# Patient Record
Sex: Male | Born: 1985 | Race: Black or African American | Hispanic: No | Marital: Single | Smoking: Current some day smoker
Health system: Southern US, Community
[De-identification: ages and names within clinical notes are randomized; demographics above are authoritative.]

---

## 2006-12-29 ENCOUNTER — Emergency Department (HOSPITAL_COMMUNITY): Admission: EM | Admit: 2006-12-29 | Discharge: 2006-12-30 | Payer: Self-pay | Admitting: Emergency Medicine

## 2008-05-11 ENCOUNTER — Emergency Department (HOSPITAL_COMMUNITY): Admission: EM | Admit: 2008-05-11 | Discharge: 2008-05-11 | Payer: Self-pay | Admitting: Emergency Medicine

## 2009-08-17 ENCOUNTER — Inpatient Hospital Stay (HOSPITAL_COMMUNITY): Admission: EM | Admit: 2009-08-17 | Discharge: 2009-08-21 | Payer: Self-pay | Admitting: Emergency Medicine

## 2009-08-17 ENCOUNTER — Ambulatory Visit: Payer: Self-pay | Admitting: Internal Medicine

## 2010-05-20 ENCOUNTER — Emergency Department (HOSPITAL_COMMUNITY): Admission: EM | Admit: 2010-05-20 | Discharge: 2010-05-20 | Payer: Self-pay | Admitting: Emergency Medicine

## 2010-11-28 ENCOUNTER — Emergency Department (HOSPITAL_COMMUNITY)
Admission: EM | Admit: 2010-11-28 | Discharge: 2010-11-28 | Disposition: A | Discharge status: Home / Self Care | Payer: Self-pay | Attending: Emergency Medicine | Admitting: Emergency Medicine

## 2010-11-28 DIAGNOSIS — K047 Periapical abscess without sinus: Secondary | ICD-10-CM | POA: Insufficient documentation

## 2010-11-28 DIAGNOSIS — K089 Disorder of teeth and supporting structures, unspecified: Secondary | ICD-10-CM | POA: Insufficient documentation

## 2010-12-10 LAB — URINE MICROSCOPIC-ADD ON

## 2010-12-10 LAB — COMPREHENSIVE METABOLIC PANEL
ALT: 141 U/L — ABNORMAL HIGH (ref 0–53)
ALT: 78 U/L — ABNORMAL HIGH (ref 0–53)
AST: 228 U/L — ABNORMAL HIGH (ref 0–37)
AST: 24 U/L (ref 0–37)
AST: 45 U/L — ABNORMAL HIGH (ref 0–37)
AST: 82 U/L — ABNORMAL HIGH (ref 0–37)
Albumin: 3.2 g/dL — ABNORMAL LOW (ref 3.5–5.2)
Albumin: 3.4 g/dL — ABNORMAL LOW (ref 3.5–5.2)
Albumin: 4 g/dL (ref 3.5–5.2)
Alkaline Phosphatase: 54 U/L (ref 39–117)
Alkaline Phosphatase: 66 U/L (ref 39–117)
Alkaline Phosphatase: 80 U/L (ref 39–117)
BUN: 12 mg/dL (ref 6–23)
BUN: 3 mg/dL — ABNORMAL LOW (ref 6–23)
CO2: 28 mEq/L (ref 19–32)
CO2: 30 mEq/L (ref 19–32)
CO2: 30 mEq/L (ref 19–32)
Calcium: 8.6 mg/dL (ref 8.4–10.5)
Calcium: 8.8 mg/dL (ref 8.4–10.5)
Chloride: 103 mEq/L (ref 96–112)
Chloride: 104 mEq/L (ref 96–112)
Chloride: 106 mEq/L (ref 96–112)
Chloride: 107 mEq/L (ref 96–112)
Creatinine, Ser: 0.98 mg/dL (ref 0.4–1.5)
Creatinine, Ser: 1.01 mg/dL (ref 0.4–1.5)
Creatinine, Ser: 1.19 mg/dL (ref 0.4–1.5)
GFR calc Af Amer: >60 mL/min (ref >60)
GFR calc Af Amer: >60 mL/min (ref >60)
GFR calc Af Amer: >60 mL/min (ref >60)
GFR calc Af Amer: >60 mL/min (ref >60)
GFR calc non Af Amer: >60 mL/min (ref >60)
GFR calc non Af Amer: >60 mL/min (ref >60)
Glucose, Bld: 167 mg/dL — ABNORMAL HIGH (ref 70–99)
Potassium: 3.4 mEq/L — ABNORMAL LOW (ref 3.5–5.1)
Potassium: 3.6 mEq/L (ref 3.5–5.1)
Potassium: 4 mEq/L (ref 3.5–5.1)
Sodium: 139 mEq/L (ref 135–145)
Sodium: 142 mEq/L (ref 135–145)
Sodium: 143 mEq/L (ref 135–145)
Total Bilirubin: 1 mg/dL (ref 0.3–1.2)
Total Bilirubin: 1.1 mg/dL (ref 0.3–1.2)
Total Bilirubin: 1.1 mg/dL (ref 0.3–1.2)
Total Bilirubin: 2.1 mg/dL — ABNORMAL HIGH (ref 0.3–1.2)
Total Protein: 5.7 g/dL — ABNORMAL LOW (ref 6.0–8.3)
Total Protein: 5.8 g/dL — ABNORMAL LOW (ref 6.0–8.3)
Total Protein: 6.7 g/dL (ref 6.0–8.3)

## 2010-12-10 LAB — DIFFERENTIAL
Basophils Absolute: 0 10*3/uL (ref 0.0–0.1)
Basophils Absolute: 0 10*3/uL (ref 0.0–0.1)
Basophils Relative: 0 % (ref 0–1)
Basophils Relative: 1 % (ref 0–1)
Eosinophils Relative: 6 % — ABNORMAL HIGH (ref 0–5)
Lymphocytes Relative: 20 % (ref 12–46)
Lymphocytes Relative: 4 % — ABNORMAL LOW (ref 12–46)
Monocytes Absolute: 0.2 10*3/uL (ref 0.1–1.0)
Monocytes Absolute: 1.4 10*3/uL — ABNORMAL HIGH (ref 0.1–1.0)
Monocytes Relative: 18 % — ABNORMAL HIGH (ref 3–12)
Neutro Abs: 9.8 10*3/uL — ABNORMAL HIGH (ref 1.7–7.7)
Neutrophils Relative %: 93 % — ABNORMAL HIGH (ref 43–77)

## 2010-12-10 LAB — CBC
HCT: 36.8 % — ABNORMAL LOW (ref 39.0–52.0)
HCT: 39.3 % (ref 39.0–52.0)
HCT: 42 % (ref 39.0–52.0)
Hemoglobin: 12.8 g/dL — ABNORMAL LOW (ref 13.0–17.0)
Hemoglobin: 13.6 g/dL (ref 13.0–17.0)
Hemoglobin: 14.6 g/dL (ref 13.0–17.0)
MCHC: 33.5 g/dL (ref 30.0–36.0)
MCHC: 34.4 g/dL (ref 30.0–36.0)
MCHC: 34.7 g/dL (ref 30.0–36.0)
MCV: 95.3 fL (ref 78.0–100.0)
MCV: 95.5 fL (ref 78.0–100.0)
MCV: 96.8 fL (ref 78.0–100.0)
MCV: 96.9 fL (ref 78.0–100.0)
Platelets: 137 10*3/uL — ABNORMAL LOW (ref 150–400)
Platelets: 143 10*3/uL — ABNORMAL LOW (ref 150–400)
Platelets: 161 10*3/uL (ref 150–400)
Platelets: 174 10*3/uL (ref 150–400)
RBC: 3.8 MIL/uL — ABNORMAL LOW (ref 4.22–5.81)
RBC: 3.89 MIL/uL — ABNORMAL LOW (ref 4.22–5.81)
RBC: 3.96 MIL/uL — ABNORMAL LOW (ref 4.22–5.81)
RDW: 12.2 % (ref 11.5–15.5)
RDW: 12.3 % (ref 11.5–15.5)
RDW: 12.4 % (ref 11.5–15.5)
WBC: 5.5 10*3/uL (ref 4.0–10.5)
WBC: 7.6 10*3/uL (ref 4.0–10.5)

## 2010-12-10 LAB — ACETAMINOPHEN LEVEL: Acetaminophen (Tylenol), Serum: <10 ug/mL — ABNORMAL LOW (ref 10–30)

## 2010-12-10 LAB — HEPATITIS PANEL, ACUTE
HCV Ab: NEGATIVE
Hepatitis B Surface Ag: NEGATIVE

## 2010-12-10 LAB — HEPATIC FUNCTION PANEL
AST: 33 U/L (ref 0–37)
AST: 656 U/L — ABNORMAL HIGH (ref 0–37)
Bilirubin, Direct: 0.3 mg/dL (ref 0.0–0.3)
Bilirubin, Direct: 1.4 mg/dL — ABNORMAL HIGH (ref 0.0–0.3)
Indirect Bilirubin: 1 mg/dL — ABNORMAL HIGH (ref 0.3–0.9)
Indirect Bilirubin: 1.3 mg/dL — ABNORMAL HIGH (ref 0.3–0.9)
Total Bilirubin: 1.3 mg/dL — ABNORMAL HIGH (ref 0.3–1.2)
Total Bilirubin: 2.7 mg/dL — ABNORMAL HIGH (ref 0.3–1.2)

## 2010-12-10 LAB — HIV ANTIBODY (ROUTINE TESTING W REFLEX): HIV: NONREACTIVE

## 2010-12-10 LAB — RAPID URINE DRUG SCREEN, HOSP PERFORMED
Amphetamines: NOT DETECTED
Barbiturates: NOT DETECTED
Benzodiazepines: NOT DETECTED

## 2010-12-10 LAB — CULTURE, BLOOD (ROUTINE X 2)

## 2010-12-10 LAB — RPR: RPR Ser Ql: NONREACTIVE

## 2010-12-10 LAB — URINALYSIS, ROUTINE W REFLEX MICROSCOPIC
Hgb urine dipstick: NEGATIVE
Nitrite: NEGATIVE
Protein, ur: NEGATIVE mg/dL
Specific Gravity, Urine: 1.031 — ABNORMAL HIGH (ref 1.005–1.030)
Urobilinogen, UA: 2 mg/dL — ABNORMAL HIGH (ref 0.0–1.0)

## 2010-12-10 LAB — HEPATITIS B SURFACE ANTIGEN: Hepatitis B Surface Ag: NEGATIVE

## 2010-12-10 LAB — ETHANOL: Alcohol, Ethyl (B): <5 mg/dL (ref 0–10)

## 2010-12-10 LAB — C-REACTIVE PROTEIN: CRP: 1.4 mg/dL — ABNORMAL HIGH (ref <0.6)

## 2010-12-10 LAB — HEPATITIS A ANTIBODY, IGM: Hep A IgM: NEGATIVE

## 2011-01-24 NOTE — Discharge Summary (Signed)
NAME:  Brent Yates, PIASCIK NO.:  [id number]   MEDICAL RECORD NO.:  [id number]          PATIENT TYPE:  EMS   LOCATION:  ED                           FACILITY:  [facility]   PHYSICIAN:  Sylvan Cheese, M.D.       DATE OF BIRTH:  14-May-1986   DATE OF ADMISSION:  12/29/2006  DATE OF DISCHARGE:  12/30/2006                               DISCHARGE SUMMARY   ADDENDUM:  Addendum to the previous discharge summary.  The patient remained in the  hospital overnight awaiting placement.  He was discharged on April 27, 2007, in stable condition.  Please note that his Lasix dose has been  increased to 80 mg by mouth twice daily.  This is the only change to his  medication list.      Sylvan Cheese, M.D.  Electronically Signed     MJ/MEDQ  D:  04/27/2007  T:  04/27/2007  Job:  [id number]

## 2011-11-13 ENCOUNTER — Emergency Department (HOSPITAL_COMMUNITY)
Admission: EM | Admit: 2011-11-13 | Discharge: 2011-11-13 | Disposition: A | Discharge status: Home / Self Care | Payer: Self-pay | Attending: Emergency Medicine | Admitting: Emergency Medicine

## 2011-11-13 ENCOUNTER — Encounter (HOSPITAL_COMMUNITY): Payer: Self-pay | Admitting: Nurse Practitioner

## 2011-11-13 ENCOUNTER — Other Ambulatory Visit: Payer: Self-pay

## 2011-11-13 ENCOUNTER — Ambulatory Visit (HOSPITAL_COMMUNITY): Admission: RE | Admit: 2011-11-13 | Payer: Self-pay | Source: Ambulatory Visit

## 2011-11-13 ENCOUNTER — Emergency Department (HOSPITAL_COMMUNITY): Payer: Self-pay

## 2011-11-13 DIAGNOSIS — R630 Anorexia: Secondary | ICD-10-CM | POA: Insufficient documentation

## 2011-11-13 DIAGNOSIS — R509 Fever, unspecified: Secondary | ICD-10-CM | POA: Insufficient documentation

## 2011-11-13 DIAGNOSIS — R1084 Generalized abdominal pain: Secondary | ICD-10-CM | POA: Insufficient documentation

## 2011-11-13 DIAGNOSIS — R112 Nausea with vomiting, unspecified: Secondary | ICD-10-CM | POA: Insufficient documentation

## 2011-11-13 DIAGNOSIS — M549 Dorsalgia, unspecified: Secondary | ICD-10-CM | POA: Insufficient documentation

## 2011-11-13 DIAGNOSIS — R197 Diarrhea, unspecified: Secondary | ICD-10-CM | POA: Insufficient documentation

## 2011-11-13 LAB — CBC
HCT: 44 % (ref 39.0–52.0)
Hemoglobin: 15.1 g/dL (ref 13.0–17.0)
MCH: 32.3 pg (ref 26.0–34.0)
MCHC: 34.3 g/dL (ref 30.0–36.0)
MCV: 94 fL (ref 78.0–100.0)

## 2011-11-13 LAB — BASIC METABOLIC PANEL
BUN: 16 mg/dL (ref 6–23)
Calcium: 9.3 mg/dL (ref 8.4–10.5)
Chloride: 104 mEq/L (ref 96–112)
Creatinine, Ser: 1.16 mg/dL (ref 0.50–1.35)
GFR calc Af Amer: >90 mL/min (ref >90)
GFR calc non Af Amer: 87 mL/min — ABNORMAL LOW (ref >90)

## 2011-11-13 LAB — URINALYSIS, ROUTINE W REFLEX MICROSCOPIC
Glucose, UA: NEGATIVE mg/dL
Ketones, ur: 15 mg/dL — AB
Protein, ur: NEGATIVE mg/dL
pH: 6 (ref 5.0–8.0)

## 2011-11-13 LAB — DIFFERENTIAL
Basophils Relative: 0 % (ref 0–1)
Eosinophils Absolute: 0.1 10*3/uL (ref 0.0–0.7)
Eosinophils Relative: 2 % (ref 0–5)
Monocytes Absolute: 1.3 10*3/uL — ABNORMAL HIGH (ref 0.1–1.0)
Monocytes Relative: 24 % — ABNORMAL HIGH (ref 3–12)

## 2011-11-13 MED ORDER — HYDROMORPHONE HCL PF 1 MG/ML IJ SOLN
1.0000 mg | Freq: Once | INTRAMUSCULAR | Status: AC
  Administered 2011-11-13: 1 mg via INTRAVENOUS
  Filled 2011-11-13: qty 1

## 2011-11-13 MED ORDER — SODIUM CHLORIDE 0.9 % IV BOLUS (SEPSIS)
1000.0000 mL | Freq: Once | INTRAVENOUS | Status: AC
  Administered 2011-11-13: 1000 mL via INTRAVENOUS

## 2011-11-13 MED ORDER — ONDANSETRON HCL 4 MG/2ML IJ SOLN
4.0000 mg | Freq: Once | INTRAMUSCULAR | Status: AC
  Administered 2011-11-13: 4 mg via INTRAVENOUS
  Filled 2011-11-13: qty 2

## 2011-11-13 MED ORDER — IOHEXOL 300 MG/ML  SOLN
20.0000 mL | INTRAMUSCULAR | Status: AC
  Administered 2011-11-13 (×2): 20 mL via ORAL

## 2011-11-13 MED ORDER — IOHEXOL 300 MG/ML  SOLN
80.0000 mL | Freq: Once | INTRAMUSCULAR | Status: AC | PRN
  Administered 2011-11-13: 80 mL via INTRAVENOUS

## 2011-11-13 MED ORDER — ONDANSETRON 8 MG PO TBDP: 8.0000 mg | ORAL_TABLET | Freq: Three times a day (TID) | ORAL | Status: AC | PRN

## 2011-11-13 MED ORDER — HYDROCODONE-ACETAMINOPHEN 5-325 MG PO TABS: 1.0000 | ORAL_TABLET | ORAL | Status: AC | PRN

## 2011-11-13 MED ORDER — DICYCLOMINE HCL 20 MG PO TABS: 20.0000 mg | ORAL_TABLET | Freq: Two times a day (BID) | ORAL | Status: DC

## 2011-11-13 NOTE — ED Notes (Signed)
Care transferred and report given to Monica, RN. 

## 2011-11-13 NOTE — ED Notes (Signed)
Pain reassessment 4/10

## 2011-11-13 NOTE — ED Provider Notes (Signed)
Patient seen for evaluation of abdominal pain with nausea and vomiting.  CT results reviewed and discussed with patient and with Dr. Sharyne Peach normal.  Labs reassuring.  Will discharge home with anti-emetic, anti-spasmodic.  Follow-up with PCP or GI as needed.   Jimmye Norman, NP 11/13/11 [phone]

## 2011-11-13 NOTE — ED Provider Notes (Signed)
Please see the initial ED note.  I was available throughout the patient's ED course.  Gerhard Munch, MD 11/13/11 [phone]

## 2011-11-13 NOTE — Discharge Instructions (Signed)
Abdominal Pain Abdominal pain can be caused by many things. Your caregiver decides the seriousness of your pain by an examination and possibly blood tests and X-rays. Many cases can be observed and treated at home. Most abdominal pain is not caused by a disease and will probably improve without treatment. However, in many cases, more time must pass before a clear cause of the pain can be found. Before that point, it may not be known if you need more testing, or if hospitalization or surgery is needed. HOME CARE INSTRUCTIONS   Do not take laxatives unless directed by your caregiver.   Take pain medicine only as directed by your caregiver.   Only take over-the-counter or prescription medicines for pain, discomfort, or fever as directed by your caregiver.   Try a clear liquid diet (broth, tea, or water) for as long as directed by your caregiver. Slowly move to a bland diet as tolerated.  SEEK IMMEDIATE MEDICAL CARE IF:   The pain does not go away.   You have a fever.   You keep throwing up (vomiting).   The pain is felt only in portions of the abdomen. Pain in the right side could possibly be appendicitis. In an adult, pain in the left lower portion of the abdomen could be colitis or diverticulitis.   You pass bloody or black tarry stools.  MAKE SURE YOU:   Understand these instructions.   Will watch your condition.   Will get help right away if you are not doing well or get worse.  Document Released: 06/04/2005 Document Revised: 08/14/2011 Document Reviewed: 04/12/2008 [facility] Patient Information 2012 [place], [place].    Take medications as directed.  Use the information provided to help locate a primary care provider.  Follow-up with your PCP or the gastroenterologist if symptoms persist despite treatment.  RESOURCE GUIDE  Dental Problems  Patients with Medicaid: [facility] [phone] [address]                                            [phone] [address] Phone:  [phone]                                                  Phone:  [phone]  If unable to pay or uninsured, contact:  [facility] or [facility]. to become qualified for the adult dental clinic.  Chronic Pain Problems Contact [facility]  [phone] Patients need to be referred by their primary care doctor.  Insufficient Money for Medicine Contact [employer]:  call "211" or [facility] ([phone].  No Primary Care Doctor Call [facility]  [phone] Other agencies that provide inexpensive medical care    [facility]  [phone]    [facility]  [phone]    [facility]  [phone]    [facility]  [phone]    [facility]  [phone]    [facility]  [phone]  Psychological Services [facility]  [phone] [facility]  [phone] [facility]   800 [phone] (  emergency services 848-336-4028)  Substance Abuse Resources [facility]  (847)385-3691 [facility] 787-587-7835 [facility] [phone] [facility] [phone] Residential & Outpatient Substance Abuse Program  4026395607  Abuse/Neglect [facility] Child Abuse Hotline 248-292-0556 [facility] Child Abuse Hotline 315-623-9675 (After Hours)  Emergency Shelter [facility] 306-638-4439  Maternity Homes [facility] [place] [facility] [phone] [facility] 6365626510  MRSA Hotline #:   (778)630-6551    [facility] Resources  [facility]     [employer]                          [facility] Dept. [address] [place]                       [address]      [address] [place] [address]  Blondell Reveal Phone:  [phone]                                    Phone:  [phone]                 Phone:  [phone]  [facility] Phone:  [phone]  [facility] Child Abuse Hotline 445-839-1128 604-576-9895 (After Hours)  [facility] [address] [facility]. [address] [place], [place] [address]  [phone]

## 2011-11-13 NOTE — ED Notes (Signed)
Patient transported to CT 

## 2011-11-13 NOTE — ED Notes (Signed)
Pt unable to urinate at this time.  States will try later. Urinal at bedside 

## 2011-11-13 NOTE — ED Provider Notes (Signed)
History     CSN: [id number]  Arrival date & time 11/13/11  1147   First MD Initiated Contact with Patient 11/13/11 1331      Chief Complaint  Patient presents with  . Abdominal Pain    (Consider location/radiation/quality/duration/timing/severity/associated sxs/prior treatment) HPI  26 year old presents with diffuse abdominal pain and back pain past 2 days. He states pain initially started in the periumbilical region and now has been spreading throughout his abdomen pain is described as a sharp and throbbing sensation. Pain radiates to his back. Worsened with movement or with laying down flat. He has been having nausea, nonbloody nonbilious vomit, and diarrhea since yesterday. He has no appetite. He has subjective fever. He cannot tolerate anything by mouth. He is unable to sleep due to this pain. He denies chest pain, shortness of breath, dysuria. He denies any recent trauma. He denies recent alcohol use he  does admits to occasional use of marijuana but denies any other recreational drug use. Denies history of appendicitis or history of abdominal surgery. He does have a remote history of elevated liver enzymes secondary to alcohol use but states he has decreased his alcohol intake significantly.   No past medical history on file.  No past surgical history on file.  No family history on file.  History  Substance Use Topics  . Smoking status: Not on file  . Smokeless tobacco: Not on file  . Alcohol Use: Yes     occassional      Review of Systems  All other systems reviewed and are negative.    Allergies  Shellfish allergy  Home Medications   Current Outpatient Rx  Name Route Sig Dispense Refill  . IBUPROFEN 200 MG PO TABS Oral Take 600 mg by mouth every 6 (six) hours as needed. For pain      BP 116/55  Pulse 70  Temp(Src) 98.5 F (36.9 C) (Oral)  Resp 20  Ht 5\' 4"  (1.626 m)  Wt 140 lb (63.504 kg)  BMI 24.03 kg/m2  SpO2 99%  Physical Exam  Nursing note and  vitals reviewed. Constitutional: He appears well-developed and well-nourished. No distress.       Awake, alert, nontoxic appearance  HENT:  Head: Atraumatic.  Eyes: Conjunctivae are normal. Right eye exhibits no discharge. Left eye exhibits no discharge.  Neck: Normal range of motion. Neck supple.  Cardiovascular: Normal rate and regular rhythm.   Pulmonary/Chest: Effort normal. No respiratory distress. He exhibits no tenderness.  Abdominal: There is generalized tenderness. There is rigidity, rebound and guarding. There is no CVA tenderness and no tenderness at McBurney's point. No hernia. Hernia confirmed negative in the ventral area.  Musculoskeletal: He exhibits no tenderness.       ROM appears intact, no obvious focal weakness  Neurological: He is alert.  Skin: Skin is warm and dry. No rash noted.  Psychiatric: He has a normal mood and affect.    ED Course  Procedures (including critical care time)   Labs Reviewed  URINALYSIS, ROUTINE W REFLEX MICROSCOPIC   No results found.   No diagnosis found.  Results for orders placed during the hospital encounter of 11/13/11  URINALYSIS, ROUTINE W REFLEX MICROSCOPIC      Component Value Range   Color, Urine YELLOW  YELLOW    APPearance CLEAR  CLEAR    Specific Gravity, Urine 1.031 (*) 1.005 - 1.030    pH 6.0  5.0 - 8.0    Glucose, UA NEGATIVE  NEGATIVE (mg/dL)  Hgb urine dipstick NEGATIVE  NEGATIVE    Bilirubin Urine SMALL (*) NEGATIVE    Ketones, ur 15 (*) NEGATIVE (mg/dL)   Protein, ur NEGATIVE  NEGATIVE (mg/dL)   Urobilinogen, UA 1.0  0.0 - 1.0 (mg/dL)   Nitrite NEGATIVE  NEGATIVE    Leukocytes, UA NEGATIVE  NEGATIVE   CBC      Component Value Range   WBC 5.6  4.0 - 10.5 (K/uL)   RBC 4.68  4.22 - 5.81 (MIL/uL)   Hemoglobin 15.1  13.0 - 17.0 (g/dL)   HCT 09.8  11.9 - 14.7 (%)   MCV 94.0  78.0 - 100.0 (fL)   MCH 32.3  26.0 - 34.0 (pg)   MCHC 34.3  30.0 - 36.0 (g/dL)   RDW 82.9  56.2 - 13.0 (%)   Platelets 202  150 -  400 (K/uL)  DIFFERENTIAL      Component Value Range   Neutrophils Relative 42 (*) 43 - 77 (%)   Neutro Abs 2.4  1.7 - 7.7 (K/uL)   Lymphocytes Relative 32  12 - 46 (%)   Lymphs Abs 1.8  0.7 - 4.0 (K/uL)   Monocytes Relative 24 (*) 3 - 12 (%)   Monocytes Absolute 1.3 (*) 0.1 - 1.0 (K/uL)   Eosinophils Relative 2  0 - 5 (%)   Eosinophils Absolute 0.1  0.0 - 0.7 (K/uL)   Basophils Relative 0  0 - 1 (%)   Basophils Absolute 0.0  0.0 - 0.1 (K/uL)  BASIC METABOLIC PANEL      Component Value Range   Sodium 137  135 - 145 (mEq/L)   Potassium 4.1  3.5 - 5.1 (mEq/L)   Chloride 104  96 - 112 (mEq/L)   CO2 25  19 - 32 (mEq/L)   Glucose, Bld 88  70 - 99 (mg/dL)   BUN 16  6 - 23 (mg/dL)   Creatinine, Ser 8.65  0.50 - 1.35 (mg/dL)   Calcium 9.3  8.4 - 78.4 (mg/dL)   GFR calc non Af Amer 87 (*) >90 (mL/min)   GFR calc Af Amer >90  >90 (mL/min)   No results found.    MDM  Pain with peritoneal signs. Concerning for rupture appendicitis. DDX includes pancreatitis, biliary hepatitis, hernia, gastritis, less concern for cholecystitis.  Low suspicion for cardiopulmonary etiology.    Work up initiated, pain medication and IVF given.  Pt is currently afebrile with stable vital sign.     3:22 PM  Normal electrolytes. His urine shows no evidence of urinary tract infection. He has no evidence of leukocytosis to indicate infection however we did order a abdominal CT scan for further evaluation as pt is markedly tender on exam. Pt will be moved to CDU while awaiting his CT scan.  Felicie Morn, NP who agrees to continue monitoring the patient.    Fayrene Helper, PA-C 11/14/11 [phone]

## 2011-11-13 NOTE — ED Notes (Signed)
C/o abd and back pain and vomiting x 2 days. Episode of diarrhea yesterday, decreased oral intake "because i dont feel like eating."

## 2011-11-13 NOTE — ED Notes (Signed)
Drinking contraST FOR ct SCAN

## 2011-11-15 NOTE — ED Provider Notes (Signed)
Medical screening examination/treatment/procedure(s) were performed by non-physician practitioner and as supervising physician I was immediately available for consultation/collaboration.   Finleigh Cheong, MD 11/15/11 1940 

## 2012-04-13 ENCOUNTER — Encounter (HOSPITAL_COMMUNITY): Payer: Self-pay

## 2012-04-13 ENCOUNTER — Emergency Department (HOSPITAL_COMMUNITY)
Admission: EM | Admit: 2012-04-13 | Discharge: 2012-04-13 | Disposition: A | Discharge status: Home / Self Care | Payer: Self-pay | Attending: Emergency Medicine | Admitting: Emergency Medicine

## 2012-04-13 ENCOUNTER — Emergency Department (HOSPITAL_COMMUNITY): Payer: Self-pay

## 2012-04-13 DIAGNOSIS — Y9367 Activity, basketball: Secondary | ICD-10-CM | POA: Insufficient documentation

## 2012-04-13 DIAGNOSIS — S93409A Sprain of unspecified ligament of unspecified ankle, initial encounter: Secondary | ICD-10-CM | POA: Insufficient documentation

## 2012-04-13 DIAGNOSIS — S86019A Strain of unspecified Achilles tendon, initial encounter: Secondary | ICD-10-CM

## 2012-04-13 DIAGNOSIS — S93402A Sprain of unspecified ligament of left ankle, initial encounter: Secondary | ICD-10-CM

## 2012-04-13 DIAGNOSIS — Y998 Other external cause status: Secondary | ICD-10-CM | POA: Insufficient documentation

## 2012-04-13 DIAGNOSIS — X58XXXA Exposure to other specified factors, initial encounter: Secondary | ICD-10-CM | POA: Insufficient documentation

## 2012-04-13 MED ORDER — NAPROXEN 375 MG PO TABS: 375.0000 mg | ORAL_TABLET | Freq: Two times a day (BID) | ORAL | Status: AC

## 2012-04-13 MED ORDER — TRAMADOL HCL 50 MG PO TABS: 50.0000 mg | ORAL_TABLET | Freq: Four times a day (QID) | ORAL | Status: AC | PRN

## 2012-04-13 NOTE — ED Notes (Signed)
Pt twisted his foot while playing basketball

## 2012-04-13 NOTE — ED Provider Notes (Signed)
History     CSN: [id number]  Arrival date & time 04/13/12  1240   First MD Initiated Contact with Patient 04/13/12 1351      Chief Complaint  Patient presents with  . LT heel/ankle pain.     (Consider location/radiation/quality/duration/timing/severity/associated sxs/prior treatment) HPI Comments: Brent Yates is a 26 y.o. Male presents with complaint of left ankle pain. States was playing basketball 3 days ago. States landed wrong. Pain not improving. States pain with walking and moving his ankle. Tried bc powder. No other treatments. No other complaints. No hx of injury that ankle.    History reviewed. No pertinent past medical history.  History reviewed. No pertinent past surgical history.  History reviewed. No pertinent family history.  History  Substance Use Topics  . Smoking status: Not on file  . Smokeless tobacco: Not on file  . Alcohol Use: Yes     occassional      Review of Systems  Constitutional: Negative for fever and chills.  Respiratory: Negative.   Cardiovascular: Negative.   Musculoskeletal: Positive for joint swelling and arthralgias.  Skin: Negative.   Neurological: Negative for weakness and numbness.    Allergies  Shellfish allergy  Home Medications   Current Outpatient Rx  Name Route Sig Dispense Refill  . EPINEPHRINE 0.3 MG/0.3ML IJ DEVI Intramuscular Inject 0.3 mg into the muscle once.    Marland Kitchen NAPROXEN 375 MG PO TABS Oral Take 1 tablet (375 mg total) by mouth 2 (two) times daily. 20 tablet 0  . TRAMADOL HCL 50 MG PO TABS Oral Take 1 tablet (50 mg total) by mouth every 6 (six) hours as needed for pain. 15 tablet 0    BP 96/57  Pulse 73  Temp 97.9 F (36.6 C) (Oral)  Resp 18  SpO2 97%  Physical Exam  Nursing note and vitals reviewed. Constitutional: He is oriented to person, place, and time. He appears well-developed and well-nourished. No distress.  Eyes: Conjunctivae are normal.  Neck: Neck supple.  Cardiovascular: Normal  rate, regular rhythm and normal heart sounds.   Pulmonary/Chest: Breath sounds normal. No respiratory distress. He has no wheezes. He has no rales.  Musculoskeletal:       Mild left ankle swelling. Tender to palpation over medial, lateral malleolus. Tender over achilles tendon. Pain with foot plantarflexion, dorsiflexion, inversion. Joint stable. Achilles tendon intact.   Neurological: He is alert and oriented to person, place, and time.  Skin: Skin is warm and dry.  Psychiatric: He has a normal mood and affect.    ED Course  Procedures (including critical care time)  Labs Reviewed - No data to display Dg Ankle Complete Left  04/13/2012  *RADIOLOGY REPORT*  Clinical Data: Pain and swelling after injury  LEFT ANKLE COMPLETE - 3+ VIEW  Comparison: None.  Findings: There is no evidence of bone, joint, or soft tissue abnormality.  IMPRESSION: Negative left ankle.  Original Report Authenticated By: Brandon Melnick, M.D.   Dg Foot Complete Left  04/13/2012  *RADIOLOGY REPORT*  Clinical Data: Pain and swelling after injury  LEFT FOOT - COMPLETE 3+ VIEW  Comparison: None.  Findings: There is no evidence of bone, joint, or soft tissue abnormality.  IMPRESSION: Negative left foot.  Original Report Authenticated By: Brandon Melnick, M.D.    1. Sprain of ankle, left   2. Achilles tendon sprain       MDM  Ankle injury 3 days ago. X-rays negative. Based on exam, no signs of achilles tendon rupture  or unstable joint. ASO splint and crutches given. Will d/chome with NSADs, follow up with orthopedics.       Lottie Mussel, PA 04/13/12 1539

## 2012-04-15 NOTE — ED Provider Notes (Signed)
Medical screening examination/treatment/procedure(s) were performed by non-physician practitioner and as supervising physician I was immediately available for consultation/collaboration.  Christianne Zacher T Mariell Nester, MD 04/15/12 0745 

## 2013-08-24 ENCOUNTER — Emergency Department (HOSPITAL_COMMUNITY)
Admission: EM | Admit: 2013-08-24 | Discharge: 2013-08-24 | Disposition: A | Discharge status: Home / Self Care | Payer: No Typology Code available for payment source | Attending: Emergency Medicine | Admitting: Emergency Medicine

## 2013-08-24 ENCOUNTER — Encounter (HOSPITAL_COMMUNITY): Payer: Self-pay | Admitting: Emergency Medicine

## 2013-08-24 ENCOUNTER — Emergency Department (HOSPITAL_COMMUNITY): Payer: No Typology Code available for payment source

## 2013-08-24 DIAGNOSIS — S0990XA Unspecified injury of head, initial encounter: Secondary | ICD-10-CM | POA: Insufficient documentation

## 2013-08-24 DIAGNOSIS — R42 Dizziness and giddiness: Secondary | ICD-10-CM | POA: Insufficient documentation

## 2013-08-24 DIAGNOSIS — S0993XA Unspecified injury of face, initial encounter: Secondary | ICD-10-CM | POA: Insufficient documentation

## 2013-08-24 DIAGNOSIS — H53149 Visual discomfort, unspecified: Secondary | ICD-10-CM | POA: Insufficient documentation

## 2013-08-24 DIAGNOSIS — Y9239 Other specified sports and athletic area as the place of occurrence of the external cause: Secondary | ICD-10-CM | POA: Insufficient documentation

## 2013-08-24 DIAGNOSIS — Y9389 Activity, other specified: Secondary | ICD-10-CM | POA: Insufficient documentation

## 2013-08-24 MED ORDER — OXYCODONE-ACETAMINOPHEN 5-325 MG PO TABS
2.0000 | ORAL_TABLET | Freq: Once | ORAL | Status: AC
  Administered 2013-08-24: 2 via ORAL
  Filled 2013-08-24: qty 2
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
## 2013-08-27 ENCOUNTER — Emergency Department (HOSPITAL_COMMUNITY)
Admission: EM | Admit: 2013-08-27 | Discharge: 2013-08-27 | Disposition: A | Discharge status: Home / Self Care | Payer: No Typology Code available for payment source | Attending: Emergency Medicine | Admitting: Emergency Medicine

## 2013-08-27 ENCOUNTER — Encounter (HOSPITAL_COMMUNITY): Payer: Self-pay | Admitting: Emergency Medicine

## 2013-08-27 ENCOUNTER — Emergency Department (HOSPITAL_COMMUNITY): Payer: No Typology Code available for payment source

## 2013-08-27 DIAGNOSIS — F17200 Nicotine dependence, unspecified, uncomplicated: Secondary | ICD-10-CM | POA: Insufficient documentation

## 2013-08-27 DIAGNOSIS — H53149 Visual discomfort, unspecified: Secondary | ICD-10-CM | POA: Insufficient documentation

## 2013-08-27 DIAGNOSIS — G8911 Acute pain due to trauma: Secondary | ICD-10-CM | POA: Insufficient documentation

## 2013-08-27 DIAGNOSIS — R51 Headache: Secondary | ICD-10-CM | POA: Insufficient documentation

## 2013-08-27 MED ORDER — DEXAMETHASONE SODIUM PHOSPHATE 10 MG/ML IJ SOLN
10.0000 mg | Freq: Once | INTRAMUSCULAR | Status: AC
  Administered 2013-08-27: 10 mg via INTRAMUSCULAR
  Filled 2013-08-27: qty 1

## 2013-08-27 MED ORDER — KETOROLAC TROMETHAMINE 60 MG/2ML IM SOLN
60.0000 mg | Freq: Once | INTRAMUSCULAR | Status: AC
  Administered 2013-08-27: 60 mg via INTRAMUSCULAR
  Filled 2013-08-27: qty 2

## 2013-08-27 MED ORDER — METOCLOPRAMIDE HCL 5 MG/ML IJ SOLN
10.0000 mg | Freq: Once | INTRAMUSCULAR | Status: AC
  Administered 2013-08-27: 10 mg via INTRAMUSCULAR
  Filled 2013-08-27: qty 2

## 2013-08-27 MED ORDER — DIPHENHYDRAMINE HCL 25 MG PO CAPS
25.0000 mg | ORAL_CAPSULE | Freq: Once | ORAL | Status: AC
  Administered 2013-08-27: 25 mg via ORAL
  Filled 2013-08-27: qty 1

## 2013-08-27 NOTE — ED Notes (Signed)
Pt c/o headache since Wednesday with seeing spots onset yesterday. Pt had another episode of seeing spots today. Pt wearing sunglasses reports makes it feel better. Pt talking in complete sentences with clear speech, no facial droop noted.
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
## 2013-08-27 NOTE — ED Provider Notes (Signed)
Medical screening examination/treatment/procedure(s) were performed by non-physician practitioner and as supervising physician I was immediately available for consultation/collaboration.  Teyla Skidgel T Jenelle Drennon, MD 08/27/13 1629 
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
## 2013-09-09 ENCOUNTER — Telehealth (HOSPITAL_COMMUNITY): Payer: Self-pay

## 2013-09-22 ENCOUNTER — Other Ambulatory Visit: Payer: Self-pay | Admitting: Internal Medicine

## 2013-09-22 ENCOUNTER — Ambulatory Visit: Payer: No Typology Code available for payment source | Attending: Internal Medicine | Admitting: Internal Medicine

## 2013-09-22 VITALS — BP 111/70 | HR 76 | Temp 98.7°F | Resp 14 | Ht 64.0 in | Wt 140.4 lb

## 2013-09-22 DIAGNOSIS — R51 Headache: Secondary | ICD-10-CM | POA: Insufficient documentation

## 2013-09-22 LAB — COMPLETE METABOLIC PANEL WITH GFR
ALK PHOS: 44 U/L (ref 39–117)
ALT: 17 U/L (ref 0–53)
AST: 34 U/L (ref 0–37)
Albumin: 4 g/dL (ref 3.5–5.2)
BUN: 22 mg/dL (ref 6–23)
CALCIUM: 8.8 mg/dL (ref 8.4–10.5)
CHLORIDE: 106 meq/L (ref 96–112)
CO2: 27 mEq/L (ref 19–32)
CREATININE: 1.17 mg/dL (ref 0.50–1.35)
GFR, EST NON AFRICAN AMERICAN: 86 mL/min
GFR, Est African American: >89 mL/min
GLUCOSE: 108 mg/dL — AB (ref 70–99)
Potassium: 3.7 mEq/L (ref 3.5–5.3)
Sodium: 141 mEq/L (ref 135–145)
Total Bilirubin: 0.8 mg/dL (ref 0.3–1.2)
Total Protein: 6.2 g/dL (ref 6.0–8.3)

## 2013-09-22 LAB — CBC WITH DIFFERENTIAL/PLATELET
BASOS ABS: 0 10*3/uL (ref 0.0–0.1)
Basophils Relative: 1 % (ref 0–1)
EOS PCT: 4 % (ref 0–5)
Eosinophils Absolute: 0.2 10*3/uL (ref 0.0–0.7)
HCT: 39.1 % (ref 39.0–52.0)
Hemoglobin: 13.3 g/dL (ref 13.0–17.0)
LYMPHS ABS: 2 10*3/uL (ref 0.7–4.0)
LYMPHS PCT: 30 % (ref 12–46)
MCH: 32.3 pg (ref 26.0–34.0)
MCHC: 34 g/dL (ref 30.0–36.0)
MCV: 94.9 fL (ref 78.0–100.0)
Monocytes Absolute: 0.9 10*3/uL (ref 0.1–1.0)
Monocytes Relative: 14 % — ABNORMAL HIGH (ref 3–12)
NEUTROS ABS: 3.4 10*3/uL (ref 1.7–7.7)
NEUTROS PCT: 51 % (ref 43–77)
PLATELETS: 209 10*3/uL (ref 150–400)
RBC: 4.12 MIL/uL — AB (ref 4.22–5.81)
RDW: 12.7 % (ref 11.5–15.5)
WBC: 6.5 10*3/uL (ref 4.0–10.5)

## 2013-09-22 LAB — LIPID PANEL
CHOL/HDL RATIO: 2 ratio
CHOLESTEROL: 126 mg/dL (ref 0–200)
HDL: 64 mg/dL (ref >39)
LDL Cholesterol: 50 mg/dL (ref 0–99)
TRIGLYCERIDES: 62 mg/dL (ref <150)
VLDL: 12 mg/dL (ref 0–40)

## 2013-09-22 MED ORDER — TOPIRAMATE 25 MG PO TABS: 25.0000 mg | ORAL_TABLET | Freq: Two times a day (BID) | ORAL | Status: DC
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
## 2013-09-22 NOTE — Addendum Note (Signed)
Addended by: Susie CassetteABROL MD, Germain OsgoodNAYANA on: 09/22/2013 03:14 PM   Modules accepted: Orders

## 2013-09-23 LAB — DRUG SCREEN, URINE, NO CONFIRMATION
AMPHETAMINE SCRN UR: NEGATIVE
BARBITURATE QUANT UR: NEGATIVE
Benzodiazepines.: NEGATIVE
Cocaine Metabolites: NEGATIVE
Creatinine,U: 264.7 mg/dL
MARIJUANA METABOLITE: POSITIVE — AB
METHADONE: NEGATIVE
OPIATE SCREEN, URINE: NEGATIVE
PROPOXYPHENE: NEGATIVE
Phencyclidine (PCP): NEGATIVE

## 2013-09-26 ENCOUNTER — Telehealth: Payer: Self-pay | Admitting: *Deleted

## 2013-09-26 NOTE — Telephone Encounter (Signed)
Message copied by Nevin Kozuch, [place] on Mon Sep 26, 2013  3:11 PM ------      Message from: Susie CassetteABROL MD, [facility] LPNAYANA      Created: Fri Sep 23, 2013 11:53 AM       Notify patient of the patient's labs are normal ------

## 2013-10-19 ENCOUNTER — Ambulatory Visit: Payer: Self-pay | Admitting: Neurology

## 2013-11-03 ENCOUNTER — Ambulatory Visit: Payer: Self-pay | Admitting: Neurology

## 2013-11-21 ENCOUNTER — Ambulatory Visit: Payer: Self-pay | Admitting: Internal Medicine

## 2013-11-25 ENCOUNTER — Encounter: Payer: Self-pay | Admitting: Neurology

## 2013-11-25 ENCOUNTER — Ambulatory Visit (INDEPENDENT_AMBULATORY_CARE_PROVIDER_SITE_OTHER): Payer: Self-pay | Admitting: Neurology

## 2013-11-25 VITALS — BP 128/70 | HR 68 | Temp 98.0°F | Resp 18 | Ht 67.0 in | Wt 141.3 lb

## 2013-11-25 DIAGNOSIS — R51 Headache: Secondary | ICD-10-CM

## 2013-11-28 ENCOUNTER — Encounter: Payer: Self-pay | Admitting: Neurology

## 2013-11-28 NOTE — Progress Notes (Signed)
No charge. 

## 2014-11-08 ENCOUNTER — Encounter (HOSPITAL_COMMUNITY): Payer: Self-pay | Admitting: *Deleted

## 2014-11-08 ENCOUNTER — Emergency Department (HOSPITAL_COMMUNITY)
Admission: EM | Admit: 2014-11-08 | Discharge: 2014-11-08 | Disposition: A | Discharge status: Home / Self Care | Payer: Self-pay | Attending: Emergency Medicine | Admitting: Emergency Medicine

## 2014-11-08 DIAGNOSIS — R000 Tachycardia, unspecified: Secondary | ICD-10-CM | POA: Insufficient documentation

## 2014-11-08 DIAGNOSIS — Z720 Tobacco use: Secondary | ICD-10-CM | POA: Insufficient documentation

## 2014-11-08 DIAGNOSIS — Z79899 Other long term (current) drug therapy: Secondary | ICD-10-CM | POA: Insufficient documentation

## 2014-11-08 DIAGNOSIS — J069 Acute upper respiratory infection, unspecified: Secondary | ICD-10-CM | POA: Insufficient documentation

## 2014-11-08 DIAGNOSIS — J029 Acute pharyngitis, unspecified: Secondary | ICD-10-CM | POA: Insufficient documentation

## 2014-11-08 LAB — RAPID STREP SCREEN (MED CTR MEBANE ONLY): STREPTOCOCCUS, GROUP A SCREEN (DIRECT): NEGATIVE

## 2014-11-08 MED ORDER — AZITHROMYCIN 250 MG PO TABS: ORAL_TABLET | ORAL | Status: DC

## 2014-11-08 MED ORDER — IBUPROFEN 100 MG/5ML PO SUSP
800.0000 mg | Freq: Once | ORAL | Status: AC
  Administered 2014-11-08: 800 mg via ORAL
  Filled 2014-11-08: qty 40

## 2014-11-08 MED ORDER — LIDOCAINE VISCOUS 2 % MT SOLN
15.0000 mL | Freq: Once | OROMUCOSAL | Status: AC
  Administered 2014-11-08: 15 mL via OROMUCOSAL
  Filled 2014-11-08: qty 15

## 2014-11-08 MED ORDER — GUAIFENESIN-CODEINE 100-10 MG/5ML PO SOLN: 5.0000 mL | Freq: Four times a day (QID) | ORAL | Status: DC | PRN

## 2014-11-08 MED ORDER — LIDOCAINE VISCOUS 2 % MT SOLN: 15.0000 mL | OROMUCOSAL | Status: DC | PRN

## 2014-11-08 NOTE — ED Notes (Signed)
Waiting on medication from pharmacy

## 2014-11-08 NOTE — ED Notes (Signed)
Pt in c/o body aches, congestion and sore throat for the last few days, no medications tried at home for symptoms, no distress noted

## 2014-11-08 NOTE — Discharge Instructions (Signed)
Pharyngitis °Pharyngitis is redness, pain, and swelling (inflammation) of your pharynx.  °CAUSES  °Pharyngitis is usually caused by infection. Most of the time, these infections are from viruses (viral) and are part of a cold. However, sometimes pharyngitis is caused by bacteria (bacterial). Pharyngitis can also be caused by allergies. Viral pharyngitis may be spread from person to person by coughing, sneezing, and personal items or utensils (cups, forks, spoons, toothbrushes). Bacterial pharyngitis may be spread from person to person by more intimate contact, such as kissing.  °SIGNS AND SYMPTOMS  °Symptoms of pharyngitis include:   °· Sore throat.   °· Tiredness (fatigue).   °· Low-grade fever.   °· Headache. °· Joint pain and muscle aches. °· Skin rashes. °· Swollen lymph nodes. °· Plaque-like film on throat or tonsils (often seen with bacterial pharyngitis). °DIAGNOSIS  °Your health care provider will ask you questions about your illness and your symptoms. Your medical history, along with a physical exam, is often all that is needed to diagnose pharyngitis. Sometimes, a rapid strep test is done. Other lab tests may also be done, depending on the suspected cause.  °TREATMENT  °Viral pharyngitis will usually get better in 3-4 days without the use of medicine. Bacterial pharyngitis is treated with medicines that kill germs (antibiotics).  °HOME CARE INSTRUCTIONS  °· Drink enough water and fluids to keep your urine clear or pale yellow.   °· Only take over-the-counter or prescription medicines as directed by your health care provider:   °· If you are prescribed antibiotics, make sure you finish them even if you start to feel better.   °· Do not take aspirin.   °· Get lots of rest.   °· Gargle with 8 oz of salt water (½ tsp of salt per 1 qt of water) as often as every 1-2 hours to soothe your throat.   °· Throat lozenges (if you are not at risk for choking) or sprays may be used to soothe your throat. °SEEK MEDICAL  CARE IF:  °· You have large, tender lumps in your neck. °· You have a rash. °· You cough up green, yellow-brown, or bloody spit. °SEEK IMMEDIATE MEDICAL CARE IF:  °· Your neck becomes stiff. °· You drool or are unable to swallow liquids. °· You vomit or are unable to keep medicines or liquids down. °· You have severe pain that does not go away with the use of recommended medicines. °· You have trouble breathing (not caused by a stuffy nose). °MAKE SURE YOU:  °· Understand these instructions. °· Will watch your condition. °· Will get help right away if you are not doing well or get worse. °Document Released: 08/25/2005 Document Revised: 06/15/2013 Document Reviewed: 05/02/2013 °ExitCare® Patient Information ©2015 [employer]. This information is not intended to replace advice given to you by your health care provider. Make sure you discuss any questions you have with your health care provider. ° °Salt Water Gargle °This solution will help make your mouth and throat feel better. °HOME CARE INSTRUCTIONS  °· Mix 1 teaspoon of salt in 8 ounces of warm water. °· Gargle with this solution as much or often as you need or as directed. Swish and gargle gently if you have any sores or wounds in your mouth. °· Do not swallow this mixture. °Document Released: 05/29/2004 Document Revised: 11/17/2011 Document Reviewed: 10/20/2008 °ExitCare® Patient Information ©2015 [employer]. This information is not intended to replace advice given to you by your health care provider. Make sure you discuss any questions you have with your health care provider. ° °  Upper Respiratory Infection, Adult °An upper respiratory infection (URI) is also sometimes known as the common cold. The upper respiratory tract includes the nose, sinuses, throat, trachea, and bronchi. Bronchi are the airways leading to the lungs. Most people improve within 1 week, but symptoms can last up to 2 weeks. A residual cough may last even longer.  °CAUSES °Many different  viruses can infect the tissues lining the upper respiratory tract. The tissues become irritated and inflamed and often become very moist. Mucus production is also common. A cold is contagious. You can easily spread the virus to others by oral contact. This includes kissing, sharing a glass, coughing, or sneezing. Touching your mouth or nose and then touching a surface, which is then touched by another person, can also spread the virus. °SYMPTOMS  °Symptoms typically develop 1 to 3 days after you come in contact with a cold virus. Symptoms vary from person to person. They may include: °· Runny nose. °· Sneezing. °· Nasal congestion. °· Sinus irritation. °· Sore throat. °· Loss of voice (laryngitis). °· Cough. °· Fatigue. °· Muscle aches. °· Loss of appetite. °· Headache. °· Low-grade fever. °DIAGNOSIS  °You might diagnose your own cold based on familiar symptoms, since most people get a cold 2 to 3 times a year. Your caregiver can confirm this based on your exam. Most importantly, your caregiver can check that your symptoms are not due to another disease such as strep throat, sinusitis, pneumonia, asthma, or epiglottitis. Blood tests, throat tests, and X-rays are not necessary to diagnose a common cold, but they may sometimes be helpful in excluding other more serious diseases. Your caregiver will decide if any further tests are required. °RISKS AND COMPLICATIONS  °You may be at risk for a more severe case of the common cold if you smoke cigarettes, have chronic heart disease (such as heart failure) or lung disease (such as asthma), or if you have a weakened immune system. The very young and very old are also at risk for more serious infections. Bacterial sinusitis, middle ear infections, and bacterial pneumonia can complicate the common cold. The common cold can worsen asthma and chronic obstructive pulmonary disease (COPD). Sometimes, these complications can require emergency medical care and may be  life-threatening. °PREVENTION  °The best way to protect against getting a cold is to practice good hygiene. Avoid oral or hand contact with people with cold symptoms. Wash your hands often if contact occurs. There is no clear evidence that vitamin C, vitamin E, echinacea, or exercise reduces the chance of developing a cold. However, it is always recommended to get plenty of rest and practice good nutrition. °TREATMENT  °Treatment is directed at relieving symptoms. There is no cure. Antibiotics are not effective, because the infection is caused by a virus, not by bacteria. Treatment may include: °· Increased fluid intake. Sports drinks offer valuable electrolytes, sugars, and fluids. °· Breathing heated mist or steam (vaporizer or shower). °· Eating chicken soup or other clear broths, and maintaining good nutrition. °· Getting plenty of rest. °· Using gargles or lozenges for comfort. °· Controlling fevers with ibuprofen or acetaminophen as directed by your caregiver. °· Increasing usage of your inhaler if you have asthma. °Zinc gel and zinc lozenges, taken in the first 24 hours of the common cold, can shorten the duration and lessen the severity of symptoms. Pain medicines may help with fever, muscle aches, and throat pain. A variety of non-prescription medicines are available to treat congestion and runny nose. Your   caregiver can make recommendations and may suggest nasal or lung inhalers for other symptoms.  °HOME CARE INSTRUCTIONS  °· Only take over-the-counter or prescription medicines for pain, discomfort, or fever as directed by your caregiver. °· Use a warm mist humidifier or inhale steam from a shower to increase air moisture. This may keep secretions moist and make it easier to breathe. °· Drink enough water and fluids to keep your urine clear or pale yellow. °· Rest as needed. °· Return to work when your temperature has returned to normal or as your caregiver advises. You may need to stay home longer to  avoid infecting others. You can also use a face mask and careful hand washing to prevent spread of the virus. °SEEK MEDICAL CARE IF:  °· After the first few days, you feel you are getting worse rather than better. °· You need your caregiver's advice about medicines to control symptoms. °· You develop chills, worsening shortness of breath, or brown or red sputum. These may be signs of pneumonia. °· You develop yellow or brown nasal discharge or pain in the face, especially when you bend forward. These may be signs of sinusitis. °· You develop a fever, swollen neck glands, pain with swallowing, or white areas in the back of your throat. These may be signs of strep throat. °SEEK IMMEDIATE MEDICAL CARE IF:  °· You have a fever. °· You develop severe or persistent headache, ear pain, sinus pain, or chest pain. °· You develop wheezing, a prolonged cough, cough up blood, or have a change in your usual mucus (if you have chronic lung disease). °· You develop sore muscles or a stiff neck. °Document Released: 02/18/2001 Document Revised: 11/17/2011 Document Reviewed: 11/30/2013 °ExitCare® Patient Information ©2015 [employer]. This information is not intended to replace advice given to you by your health care provider. Make sure you discuss any questions you have with your health care provider. ° °

## 2014-11-08 NOTE — ED Provider Notes (Signed)
CSN: [id number]     Arrival date & time 11/08/14  1358 History   First MD Initiated Contact with Patient 11/08/14 1507     Chief Complaint  Patient presents with  . URI     (Consider location/radiation/quality/duration/timing/severity/associated sxs/prior Treatment) Patient is a 29 y.o. male presenting with URI.  URI Presenting symptoms: cough and sore throat   Presenting symptoms: no fever   Cough:    Cough characteristics:  Dry   Severity:  Mild   Duration:  2 days   Timing:  Sporadic   Progression:  Improving Severity:  Severe Onset quality:  Gradual Duration:  2 days Progression:  Worsening Relieved by:  None tried Worsened by:  Eating, drinking and breathing Ineffective treatments:  None tried Associated symptoms: headaches, myalgias and swollen glands   Associated symptoms: no arthralgias, no neck pain, no sinus pain, no sneezing and no wheezing        History reviewed. No pertinent past medical history. History reviewed. No pertinent past surgical history. History reviewed. No pertinent family history. History  Substance Use Topics  . Smoking status: Current Some Day Smoker -- 0.50 packs/day  . Smokeless tobacco: Not on file  . Alcohol Use: Yes     Comment: occassional    Review of Systems  Constitutional: Positive for chills. Negative for fever.  HENT: Positive for sore throat and voice change. Negative for sneezing.   Respiratory: Positive for cough. Negative for wheezing.   Gastrointestinal: Negative for nausea.  Musculoskeletal: Positive for myalgias. Negative for arthralgias and neck pain.  Neurological: Positive for headaches.      Allergies  Shellfish allergy  Home Medications   Prior to Admission medications   Medication Sig Start Date End Date Taking? Authorizing Provider  azithromycin (ZITHROMAX Z-PAK) 250 MG tablet 2 po day one, then 1 daily x 4 days 11/08/14   Arthor Captain, PA-C  EPINEPHrine (EPIPEN 2-PAK) 0.3 mg/0.3 mL DEVI Inject 0.3  mg into the muscle once.    Historical Provider, MD  guaiFENesin-codeine 100-10 MG/5ML syrup Take 5-10 mLs by mouth every 6 (six) hours as needed for cough. 11/08/14   Arthor Captain, PA-C  lidocaine (XYLOCAINE) 2 % solution Use as directed 15 mLs in the mouth or throat every 4 (four) hours as needed for mouth pain. 11/08/14   Arthor Captain, PA-C  topiramate (TOPAMAX) 25 MG tablet Take 1 tablet (25 mg total) by mouth 2 (two) times daily. 09/22/13   Richarda Overlie, MD   BP 124/75 mmHg  Pulse 83  Temp(Src) 98.1 F (36.7 C) (Oral)  Resp 14  SpO2 100% Physical Exam  Constitutional: He is oriented to person, place, and time. He appears well-developed and well-nourished. No distress.  HENT:  Head: Normocephalic and atraumatic.  Mouth/Throat: Uvula is midline. No trismus in the jaw. Oropharyngeal exudate, posterior oropharyngeal edema and posterior oropharyngeal erythema present. No tonsillar abscesses.  Eyes: Conjunctivae are normal. No scleral icterus.  Neck: Normal range of motion. Neck supple.  Cardiovascular: Regular rhythm and normal heart sounds.   tachycardic  Pulmonary/Chest: Effort normal and breath sounds normal. No respiratory distress.  Abdominal: Soft. Bowel sounds are normal. There is no tenderness.  Musculoskeletal: He exhibits no edema.  Lymphadenopathy:    He has cervical adenopathy.  Neurological: He is alert and oriented to person, place, and time.  Skin: Skin is warm and dry. He is not diaphoretic.  Psychiatric: His behavior is normal.  Nursing note and vitals reviewed.   ED Course  Procedures (  including critical care time) Labs Review Labs Reviewed  RAPID STREP SCREEN  CULTURE, GROUP A STREP    Imaging Review No results found.   EKG Interpretation None      MDM   Final diagnoses:  Pharyngitis  URI (upper respiratory infection)    Pharyngitis/ URI D/c with azithromycin and supportive care. The patient appears reasonably screened and/or stabilized for  discharge and I doubt any other medical condition or other [facility] requiring further screening, evaluation, or treatment in the ED at this time prior to discharge.     Arthor Captainbigail [place] Antolin, PA-C 11/09/14 1147  Juliet RudeNathan R. Rubin PayorPickering, MD 11/10/14 0005

## 2014-11-10 LAB — CULTURE, GROUP A STREP: Strep A Culture: NEGATIVE

## 2015-01-15 ENCOUNTER — Emergency Department (HOSPITAL_COMMUNITY)
Admission: EM | Admit: 2015-01-15 | Discharge: 2015-01-15 | Disposition: A | Discharge status: Home / Self Care | Payer: Self-pay | Attending: Emergency Medicine | Admitting: Emergency Medicine

## 2015-01-15 ENCOUNTER — Encounter (HOSPITAL_COMMUNITY): Payer: Self-pay | Admitting: Emergency Medicine

## 2015-01-15 ENCOUNTER — Emergency Department (HOSPITAL_COMMUNITY): Payer: Self-pay

## 2015-01-15 DIAGNOSIS — M791 Myalgia: Secondary | ICD-10-CM | POA: Insufficient documentation

## 2015-01-15 DIAGNOSIS — K088 Other specified disorders of teeth and supporting structures: Secondary | ICD-10-CM | POA: Insufficient documentation

## 2015-01-15 DIAGNOSIS — K0889 Other specified disorders of teeth and supporting structures: Secondary | ICD-10-CM

## 2015-01-15 DIAGNOSIS — R1310 Dysphagia, unspecified: Secondary | ICD-10-CM | POA: Insufficient documentation

## 2015-01-15 DIAGNOSIS — Z79899 Other long term (current) drug therapy: Secondary | ICD-10-CM | POA: Insufficient documentation

## 2015-01-15 DIAGNOSIS — R630 Anorexia: Secondary | ICD-10-CM | POA: Insufficient documentation

## 2015-01-15 DIAGNOSIS — Z792 Long term (current) use of antibiotics: Secondary | ICD-10-CM | POA: Insufficient documentation

## 2015-01-15 DIAGNOSIS — R509 Fever, unspecified: Secondary | ICD-10-CM | POA: Insufficient documentation

## 2015-01-15 DIAGNOSIS — Z720 Tobacco use: Secondary | ICD-10-CM | POA: Insufficient documentation

## 2015-01-15 MED ORDER — IOHEXOL 300 MG/ML  SOLN
75.0000 mL | Freq: Once | INTRAMUSCULAR | Status: AC | PRN
  Administered 2015-01-15: 75 mL via INTRAVENOUS

## 2015-01-15 MED ORDER — IBUPROFEN 800 MG PO TABS: 800.0000 mg | ORAL_TABLET | Freq: Three times a day (TID) | ORAL | Status: DC

## 2015-01-15 MED ORDER — PENICILLIN V POTASSIUM 500 MG PO TABS: 500.0000 mg | ORAL_TABLET | Freq: Four times a day (QID) | ORAL | Status: AC

## 2015-01-15 MED ORDER — HYDROCODONE-ACETAMINOPHEN 5-325 MG PO TABS
2.0000 | ORAL_TABLET | ORAL | Status: DC | PRN
Start: 2015-01-15 — End: 2015-06-30

## 2015-01-15 NOTE — ED Provider Notes (Signed)
CSN: [id number]     Arrival date & time 01/15/15  0903 History  This chart was scribed for non-physician practitioner, Joycie PeekBenjamin Judah Chevere, PA-C, working with Eber HongBrian Miller, MD, by Ronney LionSuzanne Le, ED Scribe. This patient was seen in room TR06C/TR06C and the patient's care was started at 9:40 AM.    Chief Complaint  Patient presents with  . Oral Swelling   The history is provided by the patient. No language interpreter was used.     HPI Comments: Brent Yates is a [age] y.o. male who presents to the Emergency Department complaining of constant right sided oral and facial swelling that began 2 days ago. He also complains of severe, constant, right lower dental pain radiating to his right sided face that began yesterday, as well as feeling "bad" in general, with associated generalized myalgias, subjective fever and chills, and loss of appetite. He is able to swallow food and drink, but adds that swallowing his saliva is sometimes difficult due to the facial swelling. Trying to eat is difficult because the right side of his face is swollen, which makes closing his mouth uncomfortable. He adds that this is not so problematic since he has lost his appetite for food anyway and has not eaten much in the past 2 days. Patient denies having a dentist. He started taking penicillin 2 days ago, with no relief. He denies difficulty breathing. No other aggravating or modifying factors  History reviewed. No pertinent past medical history. History reviewed. No pertinent past surgical history. History reviewed. No pertinent family history. History  Substance Use Topics  . Smoking status: Current Some Day Smoker -- 0.50 packs/day  . Smokeless tobacco: Not on file  . Alcohol Use: Yes     Comment: occassional    Review of Systems  Constitutional: Positive for fever, chills and appetite change.  HENT: Positive for dental problem, facial swelling and trouble swallowing.   Respiratory: Negative for shortness of breath.    Musculoskeletal: Positive for myalgias.  All other systems reviewed and are negative.   Allergies  Shellfish allergy  Home Medications   Prior to Admission medications   Medication Sig Start Date End Date Taking? Authorizing Provider  azithromycin (ZITHROMAX Z-PAK) 250 MG tablet 2 po day one, then 1 daily x 4 days 11/08/14   Arthor CaptainAbigail Harris, PA-C  EPINEPHrine (EPIPEN 2-PAK) 0.3 mg/0.3 mL DEVI Inject 0.3 mg into the muscle once.    Historical Provider, MD  guaiFENesin-codeine 100-10 MG/5ML syrup Take 5-10 mLs by mouth every 6 (six) hours as needed for cough. 11/08/14   Arthor CaptainAbigail Harris, PA-C  HYDROcodone-acetaminophen (NORCO) 5-325 MG per tablet Take 2 tablets by mouth every 4 (four) hours as needed. 01/15/15   Joycie PeekBenjamin Florencia Zaccaro, PA-C  ibuprofen (ADVIL,MOTRIN) 800 MG tablet Take 1 tablet (800 mg total) by mouth 3 (three) times daily. 01/15/15   Joycie PeekBenjamin Tesean Stump, PA-C  lidocaine (XYLOCAINE) 2 % solution Use as directed 15 mLs in the mouth or throat every 4 (four) hours as needed for mouth pain. 11/08/14   Arthor CaptainAbigail Harris, PA-C  penicillin v potassium (VEETID) 500 MG tablet Take 1 tablet (500 mg total) by mouth 4 (four) times daily. 01/15/15 01/22/15  Joycie PeekBenjamin Jasmina Gendron, PA-C  topiramate (TOPAMAX) 25 MG tablet Take 1 tablet (25 mg total) by mouth 2 (two) times daily. 09/22/13   Richarda OverlieNayana Abrol, MD   BP 129/78 mmHg  Pulse 83  Temp(Src) 98.8 F (37.1 C) (Oral)  Resp 16  Ht 5\' 5"  (1.651 m)  Wt 143 lb (64.864 kg)  BMI 23.80 kg/m2  SpO2 100% Physical Exam  Constitutional: He appears well-developed and well-nourished. No distress.  HENT:  Head: Normocephalic and atraumatic.  Diffuse swelling to right cheek. No obvious intraoral abscess. Pain is located to right lower molars. Evidence of chronic premolar fracture. No unilateral tonsillar swelling. No glossal swelling Uvula is midline. No trismus. Tolerating secretions well. Patent airway.  Eyes: Right eye exhibits no discharge. Left eye exhibits no discharge.   Pulmonary/Chest: Effort normal. No respiratory distress.  Neurological: He is alert. Coordination normal.  Skin: No rash noted. He is not diaphoretic.  Psychiatric: He has a normal mood and affect. His behavior is normal.  Nursing note and vitals reviewed.   ED Course  Procedures (including critical care time) NERVE BLOCK Performed by: Sharlene Mottsartner, Vickii Volland W Consent: Verbal consent obtained. Required items: required blood products, implants, devices, and special equipment available Time out: Immediately prior to procedure a "time out" was called to verify the correct patient, procedure, equipment, support staff and site/side marked as required.  Indication: Dental pain  Nerve block body site: Right inferior alveolar   Preparation: Patient was prepped and draped in the usual sterile fashion. Needle gauge: 24 G Location technique: anatomical landmarks  Local anesthetic: Bupivacaine   Anesthetic total: 1.8 ml  Outcome: pain improved Patient tolerance: Patient tolerated the procedure well with no immediate complications.   DIAGNOSTIC STUDIES: Oxygen Saturation is 96% on RA, normal by my interpretation.    COORDINATION OF CARE: 9:45 AM - Discussed treatment plan with pt at bedside which includes Rx antibiotics and dental block, and pt agreed to plan. Will also give referral to a dentist.   9:54 AM - Based on patient's comment about having difficulty swallowing, I believe a CT neck scan is indicated. I discussed this with patient, who verbalizes understanding and agreed to plan.  Ct Soft Tissue Neck W Contrast  01/15/2015   CLINICAL DATA:  Right maxillary swelling starting Saturday.  Chills.  EXAM: CT NECK WITH CONTRAST  TECHNIQUE: Multidetector CT imaging of the neck was performed using the standard protocol following the bolus administration of intravenous contrast.  CONTRAST:  75mL OMNIPAQUE IOHEXOL 300 MG/ML  SOLN  COMPARISON:  None.  FINDINGS: Fat edema in the right face related  to dental infection of these thirty-first tooth which is carious and has a periapical erosion that breaches the buccal cortex and causes a 7 mm part fluid part gas subperiosteal abscess. 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 There is no floor of mouth swelling or airway effacement. Mild reactive adenopathy in the submental and right cervical regions.  Pharynx and larynx: Clear.  Salivary glands: Negative.  Thyroid: Negative.  Vascular: No venous thrombosis.  Limited intracranial: Negative  Visualized orbits: Negative  Mastoids and visualized paranasal sinuses: Mild focal inflammatory mucosal disease in the left ethmoids.  Skeleton: Right mandibular condensing osteitis.  Upper chest: No pneumonia.  IMPRESSION: Active infection of the tooth 31 with 7 mm subperiosteal abscess on the buccal surface.   Electronically Signed   By: Marnee SpringJonathon  Watts M.D.   On: 01/15/2015 11:15     MDM  Patient with suspected dental infection feels better after administration of oral nerve block. However, due to difficulties swallowing and subjective report of inability to tolerate secretions, will obtain CT soft tissue to evaluate for deep space infection.  Vital signs stable, patient with patent airway, no respiratory distress. CT shows active infection of tooth 31 with a 7 mm subperiosteal abscess on the buccal surface.  Patient has been given referral to dentistry, Dr. Orlene Plum and instructions to follow-up with his office in one day for definitive dental care. Discharged with oral antibiotics, pain medicines and anti-inflammatories. Instructed to return to ED for worsening symptoms. No evidence of other acute or emergent pathology that requires immediate intervention at this time.  Final diagnoses:  Pain, dental  I personally performed the services described in this documentation, which was scribed in my presence. The recorded information has been reviewed and is accurate.     Joycie Peek,  PA-C 01/15/15 1234  Eber Hong, MD 01/16/15 1700

## 2015-01-15 NOTE — ED Notes (Signed)
Pt reports oral swelling for the last 2 days. Taking leftover PCN for infection. Reports recent dental carries. Right face is swollen

## 2015-01-15 NOTE — Discharge Instructions (Signed)
You were evaluated for your dental pain today in the ED. You do have a dental infection that will require dentistry. Please take your antibiotics as prescribed. Use your pain medicine as needed for severe pain. Use anti-inflammatories as needed for moderate pain. Return to ED for new or worsening symptoms.  Dental Pain A tooth ache may be caused by cavities (tooth decay). Cavities expose the nerve of the tooth to air and hot or cold temperatures. It may come from an infection or abscess (also called a boil or furuncle) around your tooth. It is also often caused by dental caries (tooth decay). This causes the pain you are having. DIAGNOSIS  Your caregiver can diagnose this problem by exam. TREATMENT   If caused by an infection, it may be treated with medications which kill germs (antibiotics) and pain medications as prescribed by your caregiver. Take medications as directed.  Only take over-the-counter or prescription medicines for pain, discomfort, or fever as directed by your caregiver.  Whether the tooth ache today is caused by infection or dental disease, you should see your dentist as soon as possible for further care. SEEK MEDICAL CARE IF: The exam and treatment you received today has been provided on an emergency basis only. This is not a substitute for complete medical or dental care. If your problem worsens or new problems (symptoms) appear, and you are unable to meet with your dentist, call or return to this location. SEEK IMMEDIATE MEDICAL CARE IF:   You have a fever.  You develop redness and swelling of your face, jaw, or neck.  You are unable to open your mouth.  You have severe pain uncontrolled by pain medicine. MAKE SURE YOU:   Understand these instructions.  Will watch your condition.  Will get help right away if you are not doing well or get worse. Document Released: 08/25/2005 Document Revised: 11/17/2011 Document Reviewed: 04/12/2008 [facility] Patient Information  2015 [employer] [place]. This information is not intended to replace advice given to you by your health care provider. Make sure you discuss any questions you have with your health care provider.

## 2015-06-29 ENCOUNTER — Emergency Department (HOSPITAL_COMMUNITY): Payer: Self-pay

## 2015-06-29 ENCOUNTER — Emergency Department (HOSPITAL_COMMUNITY)
Admission: EM | Admit: 2015-06-29 | Discharge: 2015-06-30 | Disposition: A | Discharge status: Home / Self Care | Payer: Self-pay | Attending: Emergency Medicine | Admitting: Emergency Medicine

## 2015-06-29 ENCOUNTER — Encounter (HOSPITAL_COMMUNITY): Payer: Self-pay | Admitting: Emergency Medicine

## 2015-06-29 DIAGNOSIS — M62830 Muscle spasm of back: Secondary | ICD-10-CM | POA: Insufficient documentation

## 2015-06-29 DIAGNOSIS — R0602 Shortness of breath: Secondary | ICD-10-CM | POA: Insufficient documentation

## 2015-06-29 DIAGNOSIS — Z720 Tobacco use: Secondary | ICD-10-CM | POA: Insufficient documentation

## 2015-06-29 DIAGNOSIS — Z79899 Other long term (current) drug therapy: Secondary | ICD-10-CM | POA: Insufficient documentation

## 2015-06-29 MED ORDER — DIAZEPAM 5 MG/ML IJ SOLN
5.0000 mg | Freq: Once | INTRAMUSCULAR | Status: AC
  Administered 2015-06-30: 5 mg via INTRAVENOUS
  Filled 2015-06-29: qty 2

## 2015-06-29 MED ORDER — DIAZEPAM 5 MG PO TABS
5.0000 mg | ORAL_TABLET | Freq: Once | ORAL | Status: AC
  Administered 2015-06-29: 5 mg via ORAL
  Filled 2015-06-29: qty 1

## 2015-06-29 MED ORDER — HYDROMORPHONE HCL 1 MG/ML IJ SOLN
1.0000 mg | Freq: Once | INTRAMUSCULAR | Status: AC
  Administered 2015-06-30: 1 mg via INTRAVENOUS
  Filled 2015-06-29: qty 1

## 2015-06-29 MED ORDER — HYDROMORPHONE HCL 1 MG/ML IJ SOLN
1.0000 mg | Freq: Once | INTRAMUSCULAR | Status: AC
  Administered 2015-06-29: 1 mg via INTRAMUSCULAR
  Filled 2015-06-29: qty 1

## 2015-06-29 MED ORDER — KETOROLAC TROMETHAMINE 30 MG/ML IJ SOLN
30.0000 mg | Freq: Once | INTRAMUSCULAR | Status: AC
  Administered 2015-06-29: 30 mg via INTRAMUSCULAR
  Filled 2015-06-29: qty 1

## 2015-06-29 MED ORDER — PREDNISONE 20 MG PO TABS
60.0000 mg | ORAL_TABLET | Freq: Once | ORAL | Status: AC
  Administered 2015-06-29: 60 mg via ORAL
  Filled 2015-06-29: qty 3

## 2015-06-29 NOTE — ED Notes (Signed)
Pt c/o mid-low back pain since yesterday. Pain worse throughout the day today. Pt denies injury to back. States he took a Motrin with little relief. Pain worse with sitting. States he is unable to take a deep breath. Ambulatory to exam room with steady gait.

## 2015-06-29 NOTE — ED Provider Notes (Signed)
CSN: [id number]   Arrival date & time 06/29/15 2112  History  By signing my name below, I, Bethel Born, attest that this documentation has been prepared under the direction and in the presence of Earley Favor, FNP. Electronically Signed: Bethel Born, ED Scribe. 06/29/2015. 10:07 PM. Chief Complaint  Patient presents with  . Back Pain    HPI The history is provided by the patient. No language interpreter was used.   Brent Yates is a 29 y.o. male who presents to the Emergency Department complaining of new and increasing mid and lower left back pain with onset this morning. Pt notes that the pain shoots up the back and is worse today. He describes the pain as sharp and rates it 10/10 in severity. The pain is exacerbated by deep breathing, moving, sitting or lying, ibuprofen provided insufficient relief earlier today. Associated symptoms include SOB. No recent trauma.  No recent travel. No history of asthma. Smokes 4 cigarettes per month. Last used marijuana yesterday.   History reviewed. No pertinent past medical history.  History reviewed. No pertinent past surgical history.  No family history on file.  Social History  Substance Use Topics  . Smoking status: Current Some Day Smoker -- 0.50 packs/day  . Smokeless tobacco: None  . Alcohol Use: Yes     Comment: occassional     Review of Systems  Constitutional: Negative for fever and chills.  Respiratory: Positive for shortness of breath. Negative for cough, choking, wheezing and stridor.   Cardiovascular: Negative for chest pain.  Musculoskeletal: Positive for back pain.  Neurological: Negative for weakness.  All other systems reviewed and are negative.   Home Medications   Prior to Admission medications   Medication Sig Start Date End Date Taking? Authorizing Provider  EPINEPHrine (EPIPEN 2-PAK) 0.3 mg/0.3 mL DEVI Inject 0.3 mg into the muscle once. Anaphylaxis   Yes Historical Provider, MD  ibuprofen (ADVIL,MOTRIN)  200 MG tablet Take 200 mg by mouth every 6 (six) hours as needed for moderate pain.   Yes Historical Provider, MD  HYDROcodone-acetaminophen (NORCO/VICODIN) 5-325 MG tablet Take 1-2 tablets by mouth every 4 (four) hours as needed for moderate pain. 06/30/15   Earley Favor, NP  methocarbamol (ROBAXIN) 500 MG tablet Take 2 tablets (1,000 mg total) by mouth 3 (three) times daily. 06/30/15   Earley Favor, NP  predniSONE (DELTASONE) 10 MG tablet Take 2 tablets (20 mg total) by mouth daily with breakfast. 06/30/15   Earley Favor, NP    Allergies  Shellfish allergy  Triage Vitals: BP 118/75 mmHg  Pulse 97  Temp(Src) 98.9 F (37.2 C) (Oral)  Resp 18  SpO2 100%  Physical Exam  Constitutional: He is oriented to person, place, and time. He appears well-developed and well-nourished.  HENT:  Head: Normocephalic.  Eyes: Pupils are equal, round, and reactive to light.  Neck: Normal range of motion.  Cardiovascular: Normal rate and regular rhythm.   Pulmonary/Chest: No accessory muscle usage. No respiratory distress. He has decreased breath sounds in the right lower field. He has no wheezes. He has no rales. He exhibits tenderness.  Shallow breathing  Abdominal: Soft. He exhibits no distension. There is no tenderness.  Musculoskeletal: He exhibits no edema or tenderness.  Neurological: He is alert and oriented to person, place, and time.  Skin: Skin is warm and dry.  Nursing note and vitals reviewed.   ED Course  Procedures  DIAGNOSTIC STUDIES: Oxygen Saturation is 100% on RA,  normal by my interpretation.  COORDINATION OF CARE: 9:30 PM Discussed treatment plan which includes CXR, Dilaudid, and Valium with pt at bedside and pt agreed to the plan.  10:06 PM I re-evaluated the patient and provided an update on the results of his XR.  Labs Review- Labs Reviewed - No data to display  Imaging Review Dg Chest 2 View  06/29/2015  CLINICAL DATA:  RIGHT-sided chest and back pain radiating from  RIGHT shoulder to waist EXAM: CHEST  2 VIEW COMPARISON:  None FINDINGS: Normal heart size, mediastinal contours, and pulmonary vascularity. Lungs clear. No pneumothorax. Bones unremarkable. IMPRESSION: Normal exam. Electronically Signed   By: Ulyses SouthwardMark  Boles M.D.   On: 06/29/2015 21:54   Will get stat chest xray concern for spontaneous pneumothorax Chest xray normal after pain medication slightly improved will continue to monitor Doubt PE  No risk factors PERC score of 0. After IM Dilaudid, IM Toradol, PO Prednisone and Valium pain is still 9/10  Will have IV started and try IV Dilaudid and Valium Patient given additional Robaxin IV and PO Percocet still having pain but better. Will give work not until Wednesday MDM   Final diagnoses:  Muscle spasm of back    I personally performed the services described in this documentation, which was scribed in my presence. The recorded information has been reviewed and is accurate.     Earley FavorGail Keyarah Mcroy, NP 06/30/15 [id number]  Doug SouSam Jacubowitz, MD 06/30/15 1640

## 2015-06-29 NOTE — ED Notes (Signed)
Patient transported to X-ray 

## 2015-06-30 MED ORDER — OXYCODONE-ACETAMINOPHEN 5-325 MG PO TABS
1.0000 | ORAL_TABLET | Freq: Once | ORAL | Status: AC
  Administered 2015-06-30: 1 via ORAL
  Filled 2015-06-30: qty 1

## 2015-06-30 MED ORDER — HYDROCODONE-ACETAMINOPHEN 5-325 MG PO TABS: 1.0000 | ORAL_TABLET | ORAL | Status: DC | PRN

## 2015-06-30 MED ORDER — HYDROMORPHONE HCL 1 MG/ML IJ SOLN
1.0000 mg | Freq: Once | INTRAMUSCULAR | Status: AC
Start: 2015-06-30 — End: 2015-06-30
  Administered 2015-06-30: 1 mg via INTRAVENOUS
  Filled 2015-06-30: qty 1

## 2015-06-30 MED ORDER — METHOCARBAMOL 1000 MG/10ML IJ SOLN: 1000.0000 mg | Freq: Once | INTRAMUSCULAR | Status: DC

## 2015-06-30 MED ORDER — METHOCARBAMOL 500 MG PO TABS: 1000.0000 mg | ORAL_TABLET | Freq: Three times a day (TID) | ORAL | Status: DC

## 2015-06-30 MED ORDER — LIDOCAINE 5 % EX PTCH
1.0000 | MEDICATED_PATCH | CUTANEOUS | Status: DC
  Administered 2015-06-30: 1 via TRANSDERMAL
  Filled 2015-06-30 (×2): qty 1

## 2015-06-30 MED ORDER — PREDNISONE 10 MG PO TABS: 20.0000 mg | ORAL_TABLET | Freq: Every day | ORAL | Status: DC

## 2015-06-30 MED ORDER — METHOCARBAMOL 1000 MG/10ML IJ SOLN
1000.0000 mg | Freq: Once | INTRAVENOUS | Status: DC
  Filled 2015-06-30: qty 10

## 2015-06-30 NOTE — Discharge Instructions (Signed)
Heat Therapy Heat therapy can help ease sore, stiff, injured, and tight muscles and joints. Heat relaxes your muscles, which may help ease your pain. Heat therapy should only be used on old, pre-existing, or long-lasting (chronic) injuries. Do not use heat therapy unless told by your doctor. HOW TO USE HEAT THERAPY There are several different kinds of heat therapy, including:  Moist heat pack.  Warm water bath.  Hot water bottle.  Electric heating pad.  Heated gel pack.  Heated wrap.  Electric heating pad. GENERAL HEAT THERAPY RECOMMENDATIONS   Do not sleep while using heat therapy. Only use heat therapy while you are awake.  Your skin may turn pink while using heat therapy. Do not use heat therapy if your skin turns red.  Do not use heat therapy if you have new pain.  High heat or long exposure to heat can cause burns. Be careful when using heat therapy to avoid burning your skin.  Do not use heat therapy on areas of your skin that are already irritated, such as with a rash or sunburn. GET HELP IF:   You have blisters, redness, swelling (puffiness), or numbness.  You have new pain.  Your pain is worse. MAKE SURE YOU:  Understand these instructions.  Will watch your condition.  Will get help right away if you are not doing well or get worse.   This information is not intended to replace advice given to you by your health care provider. Make sure you discuss any questions you have with your health care provider.   Document Released: 11/17/2011 Document Revised: 09/15/2014 Document Reviewed: 10/18/2013 [employer] Interactive Patient Education 2016 [employer] Take the muscle relaxer and the steroid as directed   Use the Norco for sever pain

## 2016-03-18 ENCOUNTER — Encounter (HOSPITAL_COMMUNITY): Payer: Self-pay | Admitting: Emergency Medicine

## 2016-03-18 ENCOUNTER — Emergency Department (HOSPITAL_COMMUNITY): Payer: Self-pay

## 2016-03-18 ENCOUNTER — Emergency Department (HOSPITAL_COMMUNITY)
Admission: EM | Admit: 2016-03-18 | Discharge: 2016-03-18 | Disposition: A | Discharge status: Home / Self Care | Payer: Self-pay | Attending: Emergency Medicine | Admitting: Emergency Medicine

## 2016-03-18 DIAGNOSIS — Y9301 Activity, walking, marching and hiking: Secondary | ICD-10-CM | POA: Insufficient documentation

## 2016-03-18 DIAGNOSIS — Y999 Unspecified external cause status: Secondary | ICD-10-CM | POA: Insufficient documentation

## 2016-03-18 DIAGNOSIS — Y92511 Restaurant or cafe as the place of occurrence of the external cause: Secondary | ICD-10-CM | POA: Insufficient documentation

## 2016-03-18 DIAGNOSIS — F17200 Nicotine dependence, unspecified, uncomplicated: Secondary | ICD-10-CM | POA: Insufficient documentation

## 2016-03-18 DIAGNOSIS — S6991XA Unspecified injury of right wrist, hand and finger(s), initial encounter: Secondary | ICD-10-CM | POA: Insufficient documentation

## 2016-03-18 DIAGNOSIS — S0990XA Unspecified injury of head, initial encounter: Secondary | ICD-10-CM | POA: Insufficient documentation

## 2016-03-18 DIAGNOSIS — M542 Cervicalgia: Secondary | ICD-10-CM | POA: Insufficient documentation

## 2016-03-18 MED ORDER — SODIUM CHLORIDE 0.9 % IV BOLUS (SEPSIS)
1000.0000 mL | Freq: Once | INTRAVENOUS | Status: AC
  Administered 2016-03-18: 1000 mL via INTRAVENOUS

## 2016-03-18 MED ORDER — MORPHINE SULFATE (PF) 4 MG/ML IV SOLN
4.0000 mg | Freq: Once | INTRAVENOUS | Status: AC
Start: 2016-03-18 — End: 2016-03-18
  Administered 2016-03-18: 4 mg via INTRAVENOUS
  Filled 2016-03-18: qty 1

## 2016-03-18 MED ORDER — HYDROCODONE-ACETAMINOPHEN 5-325 MG PO TABS: 1.0000 | ORAL_TABLET | Freq: Four times a day (QID) | ORAL | Status: AC | PRN

## 2016-03-18 NOTE — Discharge Instructions (Signed)
Please follow-up with health care provider in 2 weeks for reevaluation of your wrist and hand pain. You'll need a repeat x-ray of the hand to rule out fracture. Please reattach information, please return to the emergency room if he experiences any new or worsening signs or symptoms.  Concussion, Adult A concussion, or closed-head injury, is a brain injury caused by a direct blow to the head or by a quick and sudden movement (jolt) of the head or neck. Concussions are usually not life-threatening. Even so, the effects of a concussion can be serious. If you have had a concussion before, you are more likely to experience concussion-like symptoms after a direct blow to the head.  CAUSES  Direct blow to the head, such as from running into another player during a soccer game, being hit in a fight, or hitting your head on a hard surface.  A jolt of the head or neck that causes the brain to move back and forth inside the skull, such as in a car crash. SIGNS AND SYMPTOMS The signs of a concussion can be hard to notice. Early on, they may be missed by you, family members, and health care providers. You may look fine but act or feel differently. Symptoms are usually temporary, but they may last for days, weeks, or even longer. Some symptoms may appear right away while others may not show up for hours or days. Every head injury is different. Symptoms include:  Mild to moderate headaches that will not go away.  A feeling of pressure inside your head.  Having more trouble than usual:  Learning or remembering things you have heard.  Answering questions.  Paying attention or concentrating.  Organizing daily tasks.  Making decisions and solving problems.  Slowness in thinking, acting or reacting, speaking, or reading.  Getting lost or being easily confused.  Feeling tired all the time or lacking energy (fatigued).  Feeling drowsy.  Sleep disturbances.  Sleeping more than usual.  Sleeping less  than usual.  Trouble falling asleep.  Trouble sleeping (insomnia).  Loss of balance or feeling lightheaded or dizzy.  Nausea or vomiting.  Numbness or tingling.  Increased sensitivity to:  Sounds.  Lights.  Distractions.  Vision problems or eyes that tire easily.  Diminished sense of taste or smell.  Ringing in the ears.  Mood changes such as feeling sad or anxious.  Becoming easily irritated or angry for little or no reason.  Lack of motivation.  Seeing or hearing things other people do not see or hear (hallucinations). DIAGNOSIS Your health care provider can usually diagnose a concussion based on a description of your injury and symptoms. He or she will ask whether you passed out (lost consciousness) and whether you are having trouble remembering events that happened right before and during your injury. Your evaluation might include:  A brain scan to look for signs of injury to the brain. Even if the test shows no injury, you may still have a concussion.  Blood tests to be sure other problems are not present. TREATMENT  Concussions are usually treated in an emergency department, in urgent care, or at a clinic. You may need to stay in the hospital overnight for further treatment.  Tell your health care provider if you are taking any medicines, including prescription medicines, over-the-counter medicines, and natural remedies. Some medicines, such as blood thinners (anticoagulants) and aspirin, may increase the chance of complications. Also tell your health care provider whether you have had alcohol or are taking  illegal drugs. This information may affect treatment.  Your health care provider will send you home with important instructions to follow.  How fast you will recover from a concussion depends on many factors. These factors include how severe your concussion is, what part of your brain was injured, your age, and how healthy you were before the  concussion.  Most people with mild injuries recover fully. Recovery can take time. In general, recovery is slower in older persons. Also, persons who have had a concussion in the past or have other medical problems may find that it takes longer to recover from their current injury. HOME CARE INSTRUCTIONS General Instructions  Carefully follow the directions your health care provider gave you.  Only take over-the-counter or prescription medicines for pain, discomfort, or fever as directed by your health care provider.  Take only those medicines that your health care provider has approved.  Do not drink alcohol until your health care provider says you are well enough to do so. Alcohol and certain other drugs may slow your recovery and can put you at risk of further injury.  If it is harder than usual to remember things, write them down.  If you are easily distracted, try to do one thing at a time. For example, do not try to watch TV while fixing dinner.  Talk with family members or close friends when making important decisions.  Keep all follow-up appointments. Repeated evaluation of your symptoms is recommended for your recovery.  Watch your symptoms and tell others to do the same. Complications sometimes occur after a concussion. Older adults with a brain injury may have a higher risk of serious complications, such as a blood clot on the brain.  Tell your teachers, school nurse, school counselor, coach, athletic trainer, or work Freight forwarder about your injury, symptoms, and restrictions. Tell them about what you can or cannot do. They should watch for:  Increased problems with attention or concentration.  Increased difficulty remembering or learning new information.  Increased time needed to complete tasks or assignments.  Increased irritability or decreased ability to cope with stress.  Increased symptoms.  Rest. Rest helps the brain to heal. Make sure you:  Get plenty of sleep at  night. Avoid staying up late at night.  Keep the same bedtime hours on weekends and weekdays.  Rest during the day. Take daytime naps or rest breaks when you feel tired.  Limit activities that require a lot of thought or concentration. These include:  Doing homework or job-related work.  Watching TV.  Working on the computer.  Avoid any situation where there is potential for another head injury (football, hockey, soccer, basketball, martial arts, downhill snow sports and horseback riding). Your condition will get worse every time you experience a concussion. You should avoid these activities until you are evaluated by the appropriate follow-up health care providers. Returning To Your Regular Activities You will need to return to your normal activities slowly, not all at once. You must give your body and brain enough time for recovery.  Do not return to sports or other athletic activities until your health care provider tells you it is safe to do so.  Ask your health care provider when you can drive, ride a bicycle, or operate heavy machinery. Your ability to react may be slower after a brain injury. Never do these activities if you are dizzy.  Ask your health care provider about when you can return to work or school. Preventing Another Concussion It  is very important to avoid another brain injury, especially before you have recovered. In rare cases, another injury can lead to permanent brain damage, brain swelling, or death. The risk of this is greatest during the first 7-10 days after a head injury. Avoid injuries by:  Wearing a seat belt when riding in a car.  Drinking alcohol only in moderation.  Wearing a helmet when biking, skiing, skateboarding, skating, or doing similar activities.  Avoiding activities that could lead to a second concussion, such as contact or recreational sports, until your health care provider says it is okay.  Taking safety measures in your home.  Remove  clutter and tripping hazards from floors and stairways.  Use grab bars in bathrooms and handrails by stairs.  Place non-slip mats on floors and in bathtubs.  Improve lighting in dim areas. SEEK MEDICAL CARE IF:  You have increased problems paying attention or concentrating.  You have increased difficulty remembering or learning new information.  You need more time to complete tasks or assignments than before.  You have increased irritability or decreased ability to cope with stress.  You have more symptoms than before. Seek medical care if you have any of the following symptoms for more than 2 weeks after your injury:  Lasting (chronic) headaches.  Dizziness or balance problems.  Nausea.  Vision problems.  Increased sensitivity to noise or light.  Depression or mood swings.  Anxiety or irritability.  Memory problems.  Difficulty concentrating or paying attention.  Sleep problems.  Feeling tired all the time. SEEK IMMEDIATE MEDICAL CARE IF:  You have severe or worsening headaches. These may be a sign of a blood clot in the brain.  You have weakness (even if only in one hand, leg, or part of the face).  You have numbness.  You have decreased coordination.  You vomit repeatedly.  You have increased sleepiness.  One pupil is larger than the other.  You have convulsions.  You have slurred speech.  You have increased confusion. This may be a sign of a blood clot in the brain.  You have increased restlessness, agitation, or irritability.  You are unable to recognize people or places.  You have neck pain.  It is difficult to wake you up.  You have unusual behavior changes.  You lose consciousness. MAKE SURE YOU:  Understand these instructions.  Will watch your condition.  Will get help right away if you are not doing well or get worse.   This information is not intended to replace advice given to you by your health care provider. Make sure you  discuss any questions you have with your health care provider.   Document Released: 11/15/2003 Document Revised: 09/15/2014 Document Reviewed: 03/17/2013 Elsevier Interactive Patient Education [employer]
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
## 2016-03-18 NOTE — Progress Notes (Signed)
Orthopedic Tech Progress Note Patient Details:  Brent Yates 02-Aug-1986 [id number]  Ortho Devices Type of Ortho Device: Thumb velcro splint Ortho Device/Splint Interventions: Application   [name] 03/18/2016, 1:35 PM
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
